# Patient Record
Sex: Female | Born: 1980 | Race: White | Hispanic: No | Marital: Single | Smoking: Current every day smoker
Health system: Southern US, Community
[De-identification: ages and names within clinical notes are randomized; demographics above are authoritative.]

## PROBLEM LIST (undated history)

## (undated) DIAGNOSIS — J45909 Unspecified asthma, uncomplicated: Secondary | ICD-10-CM

## (undated) DIAGNOSIS — F319 Bipolar disorder, unspecified: Secondary | ICD-10-CM

## (undated) DIAGNOSIS — K529 Noninfective gastroenteritis and colitis, unspecified: Secondary | ICD-10-CM

## (undated) DIAGNOSIS — G43909 Migraine, unspecified, not intractable, without status migrainosus: Secondary | ICD-10-CM

## (undated) DIAGNOSIS — M1990 Unspecified osteoarthritis, unspecified site: Secondary | ICD-10-CM

---

## 2000-09-11 ENCOUNTER — Emergency Department (HOSPITAL_COMMUNITY): Admission: EM | Admit: 2000-09-11 | Discharge: 2000-09-11 | Payer: Self-pay | Admitting: Emergency Medicine

## 2007-06-03 ENCOUNTER — Ambulatory Visit: Payer: Self-pay | Admitting: Obstetrics & Gynecology

## 2007-06-03 ENCOUNTER — Encounter: Payer: Self-pay | Admitting: Obstetrics & Gynecology

## 2010-02-12 ENCOUNTER — Ambulatory Visit: Payer: Self-pay | Admitting: Diagnostic Radiology

## 2010-02-12 ENCOUNTER — Emergency Department (HOSPITAL_BASED_OUTPATIENT_CLINIC_OR_DEPARTMENT_OTHER): Admission: EM | Admit: 2010-02-12 | Discharge: 2010-02-12 | Payer: Self-pay | Admitting: Emergency Medicine

## 2011-03-12 NOTE — H&P (Signed)
NAMETAIWANA, Katrina Chung                ACCOUNT NO.:  [id number]   MEDICAL RECORD NO.:  [id number]          PATIENT TYPE:  WOC   LOCATION:  WOC                          FACILITY:  [facility]   PHYSICIAN:  Norton Blizzard, M.D.    DATE OF BIRTH:  [birth year]   DATE OF ADMISSION:  06/03/2007  DATE OF DISCHARGE:                              HISTORY & PHYSICAL   OFFICE VISIT NOTE   CHIEF COMPLAINT:  Annual Pap and pelvic.   HISTORY OF PRESENT ILLNESS:  The patient is a 30 year old female, who  presents today for her annual Pap smear and pelvic.  She reports that  she has had an abnormal Pap smear in the past in October of 2006 after  which she had a colposcopy that was reported that she was benign and  just needed a yearly Pap smear after this.   The patient also reports that she is interested in changing her birth  control method.  Currently, she uses condoms and has been in a stable  relationship for over a year.  She has not had unprotected sex in over a  year.  She has tried oral contraceptive pills in the past, but states  that these cause her to be more irritable.  She is asking about other  methods and expressed interest in a NuvaRing and in Depo.   PAST MEDICAL HISTORY:  Bipolar disorder for which the patient takes  Abilify and Depakote and is followed up at [facility].   PAST SURGICAL HISTORY:  None.   MEDICATIONS:  Abilify, Depakote, Vistaril.   ALLERGIES:  PENICILLIN.   PAST MENSTRUAL HISTORY:  Age at first menses was 12, about 30 to 35 days  in between periods, periods last about six days.  Periods are medium  with only mild pain during her period.  Last menstrual period was on  May 26, 2007.   PAST OBSTETRICAL HISTORY:  None.   PAST GYNECOLOGICAL HISTORY:  Date of her last Pap smear was October 2006  after which she had a colposcopy, which was benign.   IMMUNIZATIONS:  Up to date.   FAMILY HISTORY:  Positive for high blood pressure in mother, lung cancer  in father  and grandparents, bladder cancer.   SOCIAL HISTORY:  The patient is a recovering IV drug user but has not  used for several months.  She smokes about one-half pack per day down  from two packs per day and has smoked over the past 10 years.  She does  not drink alcohol.   REVIEW OF SYSTEMS:  For the past few months positive for bruising,  weakness, fatigue, vaginal odor, pain with intercourse.   PHYSICAL EXAMINATION:  VITAL SIGNS:  Temperature 98.6, pulse 83, blood  pressure 116/75, weight 161.1 pounds, height 5 foot 6 inches.  GENERAL:  In no acute distress.  NECK:  No lymphadenopathy, no masses.  CARDIOVASCULAR:  Regular rate and rhythm, no murmurs, rubs, or gallops.  LUNGS:  Clear to auscultation bilaterally, no wheezes, rales, or  rhonchi.  ABDOMEN:  Soft, nontender, and nondistended.  PELVIC:  No external lesions,  no erythema or discharge noted in the  vaginal vault.  The cervix looks benign and also without discharge.  Pap  smear was obtained.  EXTREMITIES:  No cyanosis or edema.   LABS AND STUDIES:  Urine pregnancy test was negative.   ASSESSMENT AND PLAN:  This is a 30 year old well female here for a  yearly examination.  1. Preventative healthcare:  A Pap smear was obtained, and the patient      will be contacted about the result.  2. Birth control:  The patient was given Depo-Provera 150 mg      intramuscularly here in the clinic, instructed about the risks and      benefits of this form of birth control.  She is to return in three      months for a repeat.  Since she had a urine pregnancy test that was      negative and has not had unprotected sex within the last two weeks,      I felt it was safe to give her this.      Norton Blizzard, M.D.  Electronically Signed     SH/MEDQ  D:  06/03/2007  T:  06/04/2007  Job:  [id number]

## 2011-08-12 LAB — POCT PREGNANCY, URINE
Operator id: 148111
Preg Test, Ur: NEGATIVE

## 2013-09-12 ENCOUNTER — Emergency Department (HOSPITAL_BASED_OUTPATIENT_CLINIC_OR_DEPARTMENT_OTHER)
Admission: EM | Admit: 2013-09-12 | Discharge: 2013-09-12 | Disposition: A | Discharge status: Home / Self Care | Payer: Medicare Other | Attending: Emergency Medicine | Admitting: Emergency Medicine

## 2013-09-12 ENCOUNTER — Encounter (HOSPITAL_BASED_OUTPATIENT_CLINIC_OR_DEPARTMENT_OTHER): Payer: Self-pay | Admitting: Emergency Medicine

## 2013-09-12 DIAGNOSIS — Z3202 Encounter for pregnancy test, result negative: Secondary | ICD-10-CM | POA: Insufficient documentation

## 2013-09-12 DIAGNOSIS — R110 Nausea: Secondary | ICD-10-CM | POA: Insufficient documentation

## 2013-09-12 DIAGNOSIS — R5381 Other malaise: Secondary | ICD-10-CM | POA: Insufficient documentation

## 2013-09-12 DIAGNOSIS — R1013 Epigastric pain: Secondary | ICD-10-CM | POA: Insufficient documentation

## 2013-09-12 DIAGNOSIS — Z8739 Personal history of other diseases of the musculoskeletal system and connective tissue: Secondary | ICD-10-CM | POA: Insufficient documentation

## 2013-09-12 DIAGNOSIS — Z880 Allergy status to penicillin: Secondary | ICD-10-CM | POA: Insufficient documentation

## 2013-09-12 DIAGNOSIS — J45909 Unspecified asthma, uncomplicated: Secondary | ICD-10-CM | POA: Insufficient documentation

## 2013-09-12 DIAGNOSIS — R509 Fever, unspecified: Secondary | ICD-10-CM | POA: Insufficient documentation

## 2013-09-12 DIAGNOSIS — R630 Anorexia: Secondary | ICD-10-CM | POA: Insufficient documentation

## 2013-09-12 DIAGNOSIS — Z8679 Personal history of other diseases of the circulatory system: Secondary | ICD-10-CM | POA: Insufficient documentation

## 2013-09-12 DIAGNOSIS — F17200 Nicotine dependence, unspecified, uncomplicated: Secondary | ICD-10-CM | POA: Insufficient documentation

## 2013-09-12 DIAGNOSIS — R197 Diarrhea, unspecified: Secondary | ICD-10-CM | POA: Insufficient documentation

## 2013-09-12 DIAGNOSIS — R1032 Left lower quadrant pain: Secondary | ICD-10-CM | POA: Insufficient documentation

## 2013-09-12 DIAGNOSIS — J069 Acute upper respiratory infection, unspecified: Secondary | ICD-10-CM | POA: Insufficient documentation

## 2013-09-12 HISTORY — DX: Unspecified osteoarthritis, unspecified site: M19.90

## 2013-09-12 HISTORY — DX: Migraine, unspecified, not intractable, without status migrainosus: G43.909

## 2013-09-12 HISTORY — DX: Unspecified asthma, uncomplicated: J45.909

## 2013-09-12 LAB — CBC WITH DIFFERENTIAL/PLATELET
Basophils Relative: 0 % (ref 0–1)
Lymphs Abs: 3.3 10*3/uL (ref 0.7–4.0)
MCHC: 33.8 g/dL (ref 30.0–36.0)
Monocytes Absolute: 0.8 10*3/uL (ref 0.1–1.0)
Monocytes Relative: 9 % (ref 3–12)
Neutrophils Relative %: 52 % (ref 43–77)
RBC: 4.98 MIL/uL (ref 3.87–5.11)
RDW: 12.9 % (ref 11.5–15.5)
WBC: 9.1 10*3/uL (ref 4.0–10.5)

## 2013-09-12 LAB — COMPREHENSIVE METABOLIC PANEL
ALT: 11 U/L (ref 0–35)
AST: 21 U/L (ref 0–37)
Albumin: 4.1 g/dL (ref 3.5–5.2)
BUN: 8 mg/dL (ref 6–23)
Chloride: 103 mEq/L (ref 96–112)
GFR calc Af Amer: >90 mL/min (ref >90)
GFR calc non Af Amer: >90 mL/min (ref >90)
Glucose, Bld: 96 mg/dL (ref 70–99)
Total Protein: 7.8 g/dL (ref 6.0–8.3)

## 2013-09-12 LAB — URINALYSIS, ROUTINE W REFLEX MICROSCOPIC
Nitrite: NEGATIVE
Specific Gravity, Urine: 1.008 (ref 1.005–1.030)
pH: 6.5 (ref 5.0–8.0)

## 2013-09-12 LAB — URINE MICROSCOPIC-ADD ON

## 2013-09-12 LAB — PREGNANCY, URINE: Preg Test, Ur: NEGATIVE

## 2013-09-12 MED ORDER — HYDROCOD POLST-CHLORPHEN POLST 10-8 MG/5ML PO LQCR: 5.0000 mL | Freq: Two times a day (BID) | ORAL | Status: DC | PRN

## 2013-09-12 MED ORDER — SODIUM CHLORIDE 0.9 % IV BOLUS (SEPSIS)
1000.0000 mL | Freq: Once | INTRAVENOUS | Status: AC
  Administered 2013-09-12: 1000 mL via INTRAVENOUS

## 2013-09-12 MED ORDER — PREDNISONE 20 MG PO TABS: 40.0000 mg | ORAL_TABLET | Freq: Every day | ORAL | Status: AC

## 2013-09-12 NOTE — ED Notes (Signed)
Pt reports sorethroat and body aches and lung pain with deep breathing intermittant fever x 1 week OTC meds given minmal relief

## 2013-09-12 NOTE — ED Provider Notes (Signed)
CSN: [id number]     Arrival date & time 09/12/13  2109 History  This chart was scribed for Gerhard Munch, MD by Leone Payor, ED Scribe. This patient was seen in room MH07/MH07 and the patient's care was started 9:35 PM.    Chief Complaint  Patient presents with  . Sore Throat    The history is provided by the patient. No language interpreter was used.    HPI Comments: Katrina Chung is a 32 y.o. female who presents to the Emergency Department complaining of 3 weeks of gradual onset, gradually worsening, constant sore throat. Pt states she initially began to feel fatigued but now is having decreased appetite, non-productive cough, intermittent fevers (TMAX 101.7)  in the last 1-2 weeks. She also reports having left sided abdominal pain with associated nausea and diarrhea in the last few days. She has tried OTC medications without significant relief. She denies vomiting. Pt has history of strep throat but states these symptoms are not similar. She denies history of mononucleosis. Pt has h/o colitis. Pt is a current everyday smoker but denies alcohol use. Pt denies taking regular daily medications.     Past Medical History  Diagnosis Date  . Asthma   . Migraine   . Arthritis    History reviewed. No pertinent past surgical history. History reviewed. No pertinent family history. History  Substance Use Topics  . Smoking status: Current Every Day Smoker  . Smokeless tobacco: Not on file  . Alcohol Use: No   OB History   Grav Para Term Preterm Abortions TAB SAB Ect Mult Living                 Review of Systems  Constitutional: Positive for fever, appetite change (decreased ) and fatigue.       Per HPI, otherwise negative  HENT: Positive for sore throat.        Per HPI, otherwise negative  Respiratory: Positive for cough.        Per HPI, otherwise negative  Cardiovascular:       Per HPI, otherwise negative  Gastrointestinal: Positive for nausea, abdominal pain and diarrhea.  Negative for vomiting.  Endocrine:       Negative aside from HPI  Genitourinary:       Neg aside from HPI   Musculoskeletal:       Per HPI, otherwise negative  Skin: Negative.   Neurological: Negative for syncope.    Allergies  Lyrica and Penicillins  Home Medications  No current outpatient prescriptions on file. BP 133/72  Pulse 75  Temp(Src) 99 F (37.2 C) (Oral)  Resp 18  SpO2 99% Physical Exam  Nursing note and vitals reviewed. Constitutional: She is oriented to person, place, and time. She appears well-developed and well-nourished. No distress.  HENT:  Head: Normocephalic and atraumatic.  Mouth/Throat: Uvula is midline and mucous membranes are normal. Posterior oropharyngeal erythema present. No oropharyngeal exudate, posterior oropharyngeal edema or tonsillar abscesses.  Eyes: Conjunctivae and EOM are normal.  Cardiovascular: Normal rate and regular rhythm.   Pulmonary/Chest: Effort normal and breath sounds normal. No stridor. No respiratory distress.  Abdominal: Soft. Normal appearance and bowel sounds are normal. She exhibits no distension. There is tenderness (non-peritoneal ) in the epigastric area and left upper quadrant. There is no rigidity, no rebound and no guarding.  Musculoskeletal: She exhibits no edema.  Lymphadenopathy:    She has cervical adenopathy (symmetrical ).  Neurological: She is alert and oriented to person, place, and time.  No cranial nerve deficit.  Skin: Skin is warm and dry.  Psychiatric: She has a normal mood and affect.    ED Course  Procedures   DIAGNOSTIC STUDIES: Oxygen Saturation is 99% on RA, normal by my interpretation.    COORDINATION OF CARE: 9:47 PM Will order CBC, CMP, Mononucleosis screen, UA, urine pregnancy test. Discussed treatment plan with pt at bedside and pt agreed to plan.  Labs Review Labs Reviewed  CBC WITH DIFFERENTIAL - Abnormal; Notable for the following:    Hemoglobin 15.5 (*)    All other components  within normal limits  URINALYSIS, ROUTINE W REFLEX MICROSCOPIC - Abnormal; Notable for the following:    Hgb urine dipstick LARGE (*)    All other components within normal limits  URINE MICROSCOPIC-ADD ON - Abnormal; Notable for the following:    Squamous Epithelial / LPF FEW (*)    Bacteria, UA FEW (*)    All other components within normal limits  COMPREHENSIVE METABOLIC PANEL  MONONUCLEOSIS SCREEN  PREGNANCY, URINE   Imaging Review No results found.  EKG Interpretation   None      Update: On repeat exam the patient appears comfortable.  We had a discussion on all results.  We discussed the presumptive diagnosis of cough/upper respiratory infection. Patient states that she has follow up care, will followup. MDM  No diagnosis found.  I personally performed the services described in this documentation, which was scribed in my presence. The recorded information has been reviewed and is accurate.   This patient presents with weeks of ongoing concerns.  On exam she is awake and alert, afebrile, hemodynamically stable.  She does have posterior oropharyngeal erythema, no exudate, no asymmetry, no evidence of either impending respiratory, mice, nor deep space abscess. Tissues lungs are clear.  She does have mild abdominal discomfort, but no peritonitis.  With no evidence of distress, largely reassuring labs, the patient was stable for discharge with presented diagnosis of upper respiratory infection.  With her history of colitis, she was also encouraged to followup with gastroenterology, though this seems less likely.  Patient was discharged in stable condition.  Gerhard Munch, MD 09/12/13 [phone]

## 2014-07-31 ENCOUNTER — Emergency Department (HOSPITAL_COMMUNITY)
Admission: EM | Admit: 2014-07-31 | Discharge: 2014-07-31 | Disposition: A | Discharge status: Home / Self Care | Payer: Medicare Other | Attending: Emergency Medicine | Admitting: Emergency Medicine

## 2014-07-31 ENCOUNTER — Emergency Department (HOSPITAL_COMMUNITY): Payer: Medicare Other

## 2014-07-31 ENCOUNTER — Encounter (HOSPITAL_COMMUNITY): Payer: Self-pay | Admitting: Emergency Medicine

## 2014-07-31 DIAGNOSIS — Z720 Tobacco use: Secondary | ICD-10-CM | POA: Diagnosis not present

## 2014-07-31 DIAGNOSIS — R0789 Other chest pain: Secondary | ICD-10-CM | POA: Insufficient documentation

## 2014-07-31 DIAGNOSIS — J45901 Unspecified asthma with (acute) exacerbation: Secondary | ICD-10-CM | POA: Diagnosis present

## 2014-07-31 DIAGNOSIS — Z8659 Personal history of other mental and behavioral disorders: Secondary | ICD-10-CM | POA: Insufficient documentation

## 2014-07-31 DIAGNOSIS — Z79899 Other long term (current) drug therapy: Secondary | ICD-10-CM | POA: Diagnosis not present

## 2014-07-31 DIAGNOSIS — Z7951 Long term (current) use of inhaled steroids: Secondary | ICD-10-CM | POA: Diagnosis not present

## 2014-07-31 DIAGNOSIS — Z880 Allergy status to penicillin: Secondary | ICD-10-CM | POA: Insufficient documentation

## 2014-07-31 DIAGNOSIS — Z8719 Personal history of other diseases of the digestive system: Secondary | ICD-10-CM | POA: Insufficient documentation

## 2014-07-31 DIAGNOSIS — Z8679 Personal history of other diseases of the circulatory system: Secondary | ICD-10-CM | POA: Insufficient documentation

## 2014-07-31 DIAGNOSIS — Z8739 Personal history of other diseases of the musculoskeletal system and connective tissue: Secondary | ICD-10-CM | POA: Diagnosis not present

## 2014-07-31 HISTORY — DX: Bipolar disorder, unspecified: F31.9

## 2014-07-31 HISTORY — DX: Noninfective gastroenteritis and colitis, unspecified: K52.9

## 2014-07-31 MED ORDER — ALBUTEROL SULFATE HFA 108 (90 BASE) MCG/ACT IN AERS
6.0000 | INHALATION_SPRAY | Freq: Once | RESPIRATORY_TRACT | Status: AC
  Administered 2014-07-31: 6 via RESPIRATORY_TRACT
  Filled 2014-07-31: qty 6.7

## 2014-07-31 MED ORDER — PREDNISONE 10 MG PO TABS: 60.0000 mg | ORAL_TABLET | Freq: Once | ORAL | Status: DC

## 2014-07-31 MED ORDER — PREDNISONE 20 MG PO TABS
60.0000 mg | ORAL_TABLET | Freq: Once | ORAL | Status: AC
  Administered 2014-07-31: 60 mg via ORAL
  Filled 2014-07-31: qty 3

## 2014-07-31 NOTE — ED Notes (Signed)
She has been staying in a home with mold and due to all the rain this week the mold has been worse and it has made her asthma feel worse. She tried her inhalers with no relief. She is breathing easily now. No pain

## 2014-07-31 NOTE — ED Notes (Signed)
Has been staying in her friends basement x 2 weeks  And it has mold and she feels  Like  Her asthma worse her inhalers not working,  [employer] CorporationFeels weak

## 2014-07-31 NOTE — Discharge Instructions (Signed)

## 2014-07-31 NOTE — ED Provider Notes (Signed)
CSN: [id number]     Arrival date & time 07/31/14  1335 History   First MD Initiated Contact with Patient 07/31/14 1346     Chief Complaint  Patient presents with  . Asthma     (Consider location/radiation/quality/duration/timing/severity/associated sxs/prior Treatment) Patient is a 33 y.o. female presenting with shortness of breath.  Shortness of Breath Severity:  Severe Onset quality:  Gradual Duration: several days. Timing:  Constant Progression:  Worsening Chronicity:  Recurrent Context comment:  Exposed to mold Relieved by: albuterol. Exacerbated by: mold. Associated symptoms: chest pain (tightness) and cough   Associated symptoms: no fever     Past Medical History  Diagnosis Date  . Asthma   . Migraine   . Arthritis   . Bipolar 1 disorder   . Colitis    History reviewed. No pertinent past surgical history. History reviewed. No pertinent family history. History  Substance Use Topics  . Smoking status: Current Every Day Smoker  . Smokeless tobacco: Not on file  . Alcohol Use: No   OB History   Grav Para Term Preterm Abortions TAB SAB Ect Mult Living                 Review of Systems  Constitutional: Negative for fever.  Respiratory: Positive for cough and shortness of breath.   Cardiovascular: Positive for chest pain (tightness).  All other systems reviewed and are negative.     Allergies  Neurontin; Penicillins; and Lyrica  Home Medications   Prior to Admission medications   Medication Sig Start Date End Date Taking? Authorizing Provider  albuterol (PROVENTIL HFA;VENTOLIN HFA) 108 (90 BASE) MCG/ACT inhaler Inhale 2 puffs into the lungs every 6 (six) hours as needed for wheezing or shortness of breath.   Yes Historical Provider, MD  Etonogestrel (NEXPLANON [place]) Inject 1 each into the skin.   Yes Historical Provider, MD  mometasone-formoterol (DULERA) 100-5 MCG/ACT AERO Inhale 2 puffs into the lungs 2 (two) times daily.   Yes Historical Provider, MD    BP 145/85  Pulse 96  Temp(Src) 99.6 F (37.6 C) (Oral)  Resp 22  SpO2 97% Physical Exam  Nursing note and vitals reviewed. Constitutional: She is oriented to person, place, and time. She appears well-developed and well-nourished. No distress.  HENT:  Head: Normocephalic and atraumatic.  Mouth/Throat: Oropharynx is clear and moist.  Eyes: Conjunctivae are normal. Pupils are equal, round, and reactive to light. No scleral icterus.  Neck: Neck supple.  Cardiovascular: Normal rate, regular rhythm, normal heart sounds and intact distal pulses.   No murmur heard. Pulmonary/Chest: Effort normal. No stridor. No respiratory distress. She has no decreased breath sounds (Good air movement). She has wheezes (Minimal Scattered). She has no rales.  Abdominal: Soft. Bowel sounds are normal. She exhibits no distension. There is no tenderness.  Musculoskeletal: Normal range of motion.  Neurological: She is alert and oriented to person, place, and time.  Skin: Skin is warm and dry. No rash noted.  Psychiatric: She has a normal mood and affect. Her behavior is normal.    ED Course  Procedures (including critical care time) Labs Review Labs Reviewed - No data to display  Imaging Review Dg Chest 2 View (if Patient Has Fever And/or Copd)  07/31/2014   CLINICAL DATA:  Pre of asthma; onset of shortness of breath today ; reported exposure to mold; current tobacco use  EXAM: CHEST  2 VIEW  COMPARISON:  PA and lateral chest of February 12, 2010  FINDINGS: The lungs  are well-expanded. There is no hemidiaphragm flattening. There is a stable calcified nodule in the right infrahilar region. The heart and pulmonary vascularity are within the limits of normal. The interstitial markings are mildly increased though stable. There is no pleural effusion. The observed bony thorax is unremarkable.  IMPRESSION: There is no evidence of pneumonia nor CHF. Mild interstitial prominence is stable and may reflect the patient's  smoking history.   Electronically Signed   By: David  [place]   On: 07/31/2014 14:24  All radiology studies independently viewed by me.      EKG Interpretation None      MDM   Final diagnoses:  Acute asthma exacerbation, unspecified asthma severity    33 year old female with a history of asthma presenting with shortness of breath. She thinks symptoms consistent with asthma attack, felt to be triggered by mold exposure. She felt better by the time of my examination.  She had good air movement, good O2 sats, no increased work of breathing, and mild scattered wheezes. Given additional albuterol treatment and steroids. She appeared stable for discharge and outpatient treatment. She was given return precautions.  She was advised to followup with her primary doctor. We discussed smoking cessation.    Warnell Forester, MD 07/31/14 [phone]

## 2015-09-02 ENCOUNTER — Encounter (HOSPITAL_BASED_OUTPATIENT_CLINIC_OR_DEPARTMENT_OTHER): Payer: Self-pay

## 2015-09-02 ENCOUNTER — Emergency Department (HOSPITAL_BASED_OUTPATIENT_CLINIC_OR_DEPARTMENT_OTHER)
Admission: EM | Admit: 2015-09-02 | Discharge: 2015-09-02 | Disposition: A | Discharge status: Home / Self Care | Payer: Medicare Other | Attending: Emergency Medicine | Admitting: Emergency Medicine

## 2015-09-02 ENCOUNTER — Emergency Department (HOSPITAL_BASED_OUTPATIENT_CLINIC_OR_DEPARTMENT_OTHER): Payer: Medicare Other

## 2015-09-02 DIAGNOSIS — Z880 Allergy status to penicillin: Secondary | ICD-10-CM | POA: Insufficient documentation

## 2015-09-02 DIAGNOSIS — Z8659 Personal history of other mental and behavioral disorders: Secondary | ICD-10-CM | POA: Insufficient documentation

## 2015-09-02 DIAGNOSIS — Y9389 Activity, other specified: Secondary | ICD-10-CM | POA: Diagnosis not present

## 2015-09-02 DIAGNOSIS — J45909 Unspecified asthma, uncomplicated: Secondary | ICD-10-CM | POA: Diagnosis not present

## 2015-09-02 DIAGNOSIS — Y9289 Other specified places as the place of occurrence of the external cause: Secondary | ICD-10-CM | POA: Insufficient documentation

## 2015-09-02 DIAGNOSIS — S9002XA Contusion of left ankle, initial encounter: Secondary | ICD-10-CM | POA: Diagnosis not present

## 2015-09-02 DIAGNOSIS — Z8719 Personal history of other diseases of the digestive system: Secondary | ICD-10-CM | POA: Insufficient documentation

## 2015-09-02 DIAGNOSIS — Z79899 Other long term (current) drug therapy: Secondary | ICD-10-CM | POA: Insufficient documentation

## 2015-09-02 DIAGNOSIS — Z8679 Personal history of other diseases of the circulatory system: Secondary | ICD-10-CM | POA: Insufficient documentation

## 2015-09-02 DIAGNOSIS — Z720 Tobacco use: Secondary | ICD-10-CM | POA: Diagnosis not present

## 2015-09-02 DIAGNOSIS — S99912A Unspecified injury of left ankle, initial encounter: Secondary | ICD-10-CM

## 2015-09-02 DIAGNOSIS — Z8739 Personal history of other diseases of the musculoskeletal system and connective tissue: Secondary | ICD-10-CM | POA: Diagnosis not present

## 2015-09-02 DIAGNOSIS — Y998 Other external cause status: Secondary | ICD-10-CM | POA: Insufficient documentation

## 2015-09-02 MED ORDER — TRAMADOL HCL 50 MG PO TABS: 50.0000 mg | ORAL_TABLET | Freq: Four times a day (QID) | ORAL | Status: AC | PRN
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
## 2015-09-02 NOTE — Discharge Instructions (Signed)
Take the prescribed medication as directed for pain. You may supplement this with Tylenol or Motrin if needed. Recommended to ice and elevate ankle at home to help with pain and swelling. Use crutches for now to be nonweightbearing, progress back to full weightbearing as tolerated. Follow-up with your primary care physician. If ankle does not appear to be healing appropriately over the next week, follow-up with orthopedics, Dr. Pearletha ForgeHudnall. Return to the ED for new or worsening symptoms.

## 2017-05-04 IMAGING — DX DG FOOT COMPLETE 3+V*L*
3 series · 3 of 3 positions shown · non-contrast
Comparison: None.

CLINICAL DATA: 34-year-old female with a history of ankle injury.

EXAM:
LEFT FOOT - COMPLETE 3+ VIEW

[foot ap]
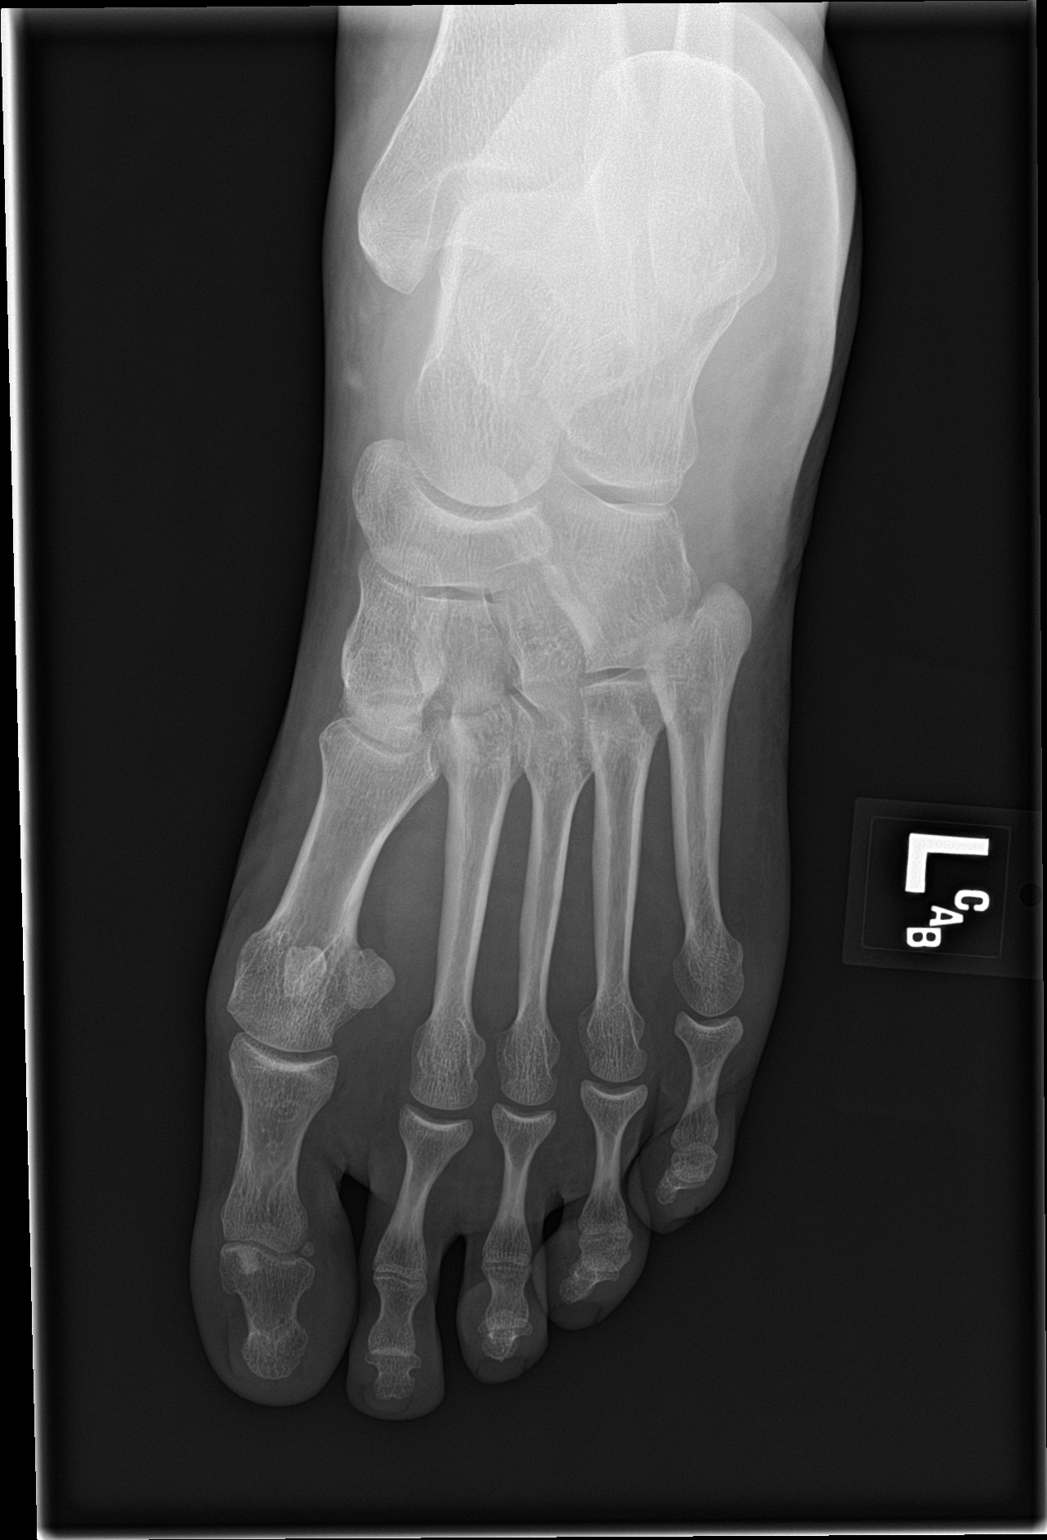

[foot obl]
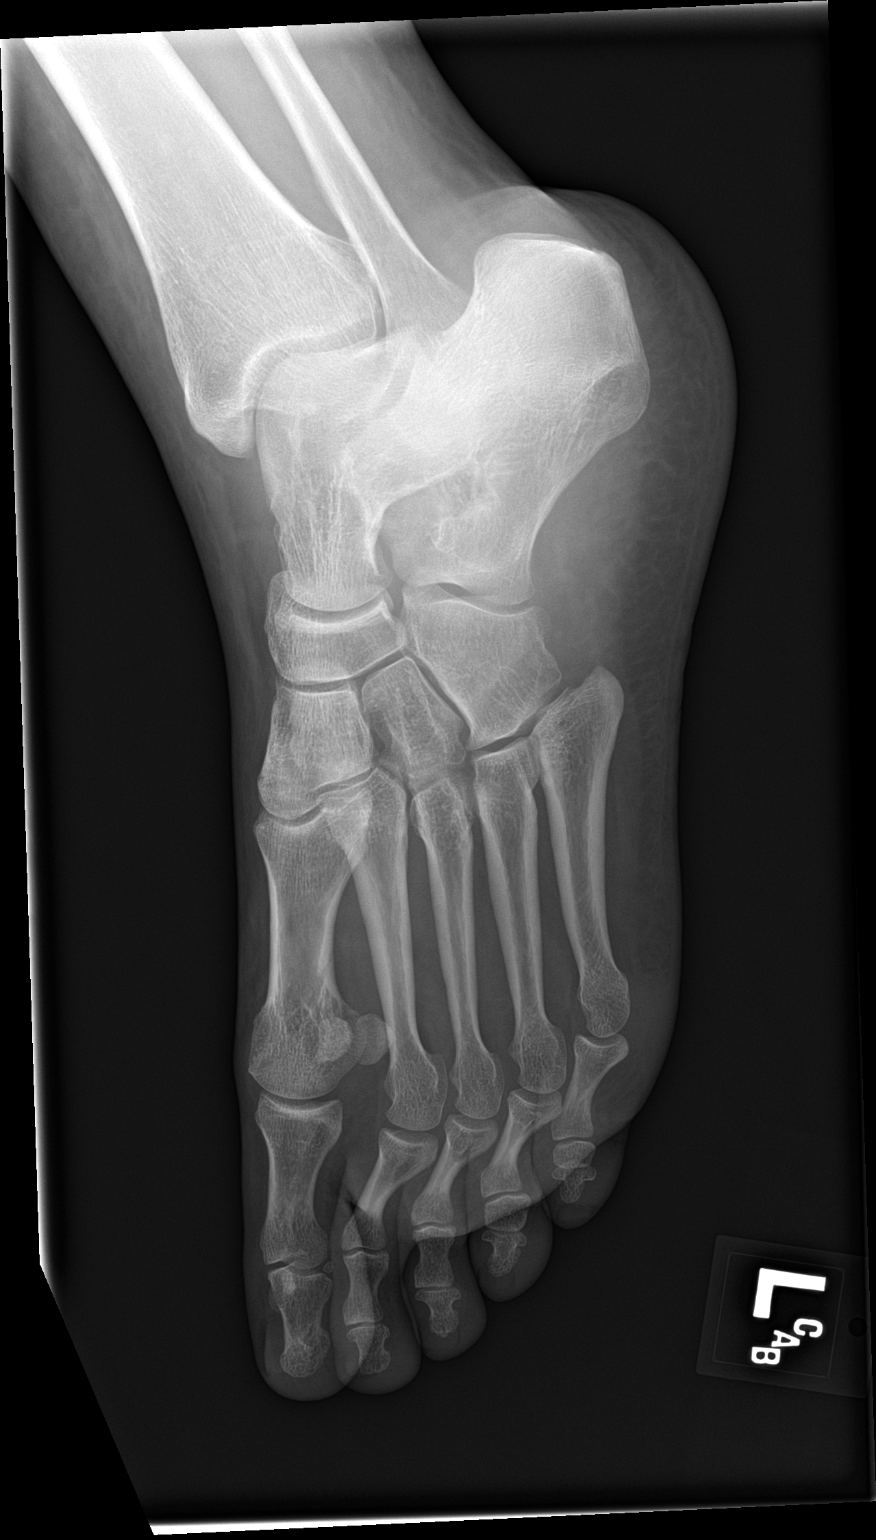

[foot lat]
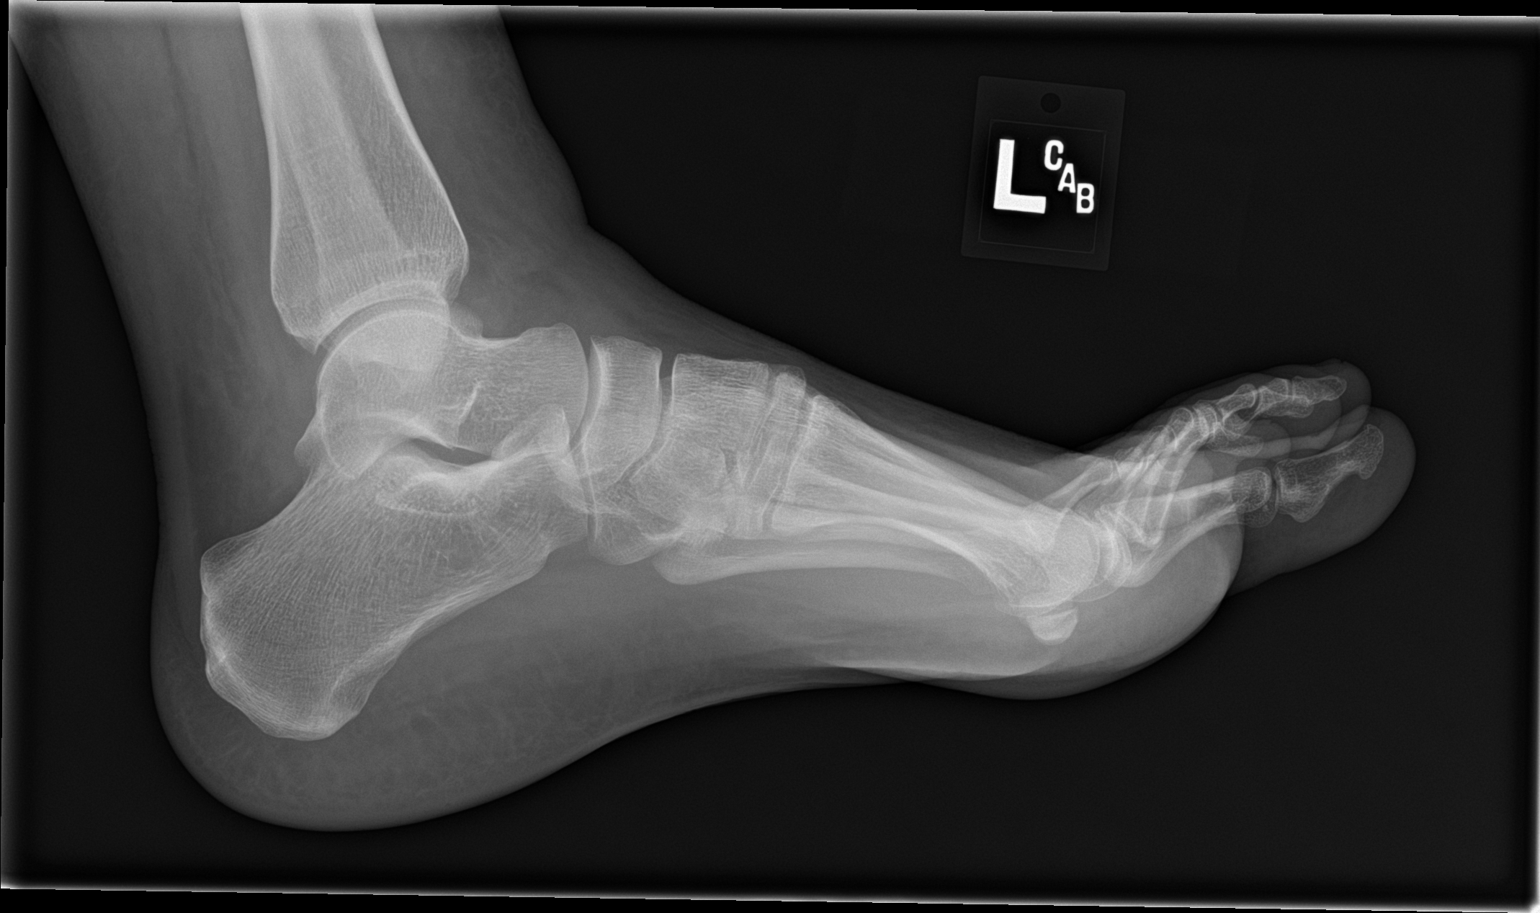

[3 of 3 positions shown; findings below may reference images not displayed]

FINDINGS: No acute fracture. No significant soft tissue swelling. No
radiopaque foreign body. No significant degenerative changes.
IMPRESSION: No acute bony abnormality.

## 2017-06-28 DEATH — deceased
